# Patient Record
Sex: Male | Born: 1970 | Race: Black or African American | Hispanic: No | Smoking: Current every day smoker
Health system: Southern US, Community
[De-identification: ages and names within clinical notes are randomized; demographics above are authoritative.]

---

## 2020-01-20 ENCOUNTER — Encounter (HOSPITAL_COMMUNITY): Payer: Self-pay

## 2020-01-20 ENCOUNTER — Emergency Department (HOSPITAL_COMMUNITY)
Admission: EM | Admit: 2020-01-20 | Discharge: 2020-01-20 | Disposition: A | Discharge status: Home / Self Care | Payer: Self-pay | Attending: Emergency Medicine | Admitting: Emergency Medicine

## 2020-01-20 ENCOUNTER — Other Ambulatory Visit: Payer: Self-pay

## 2020-01-20 ENCOUNTER — Emergency Department (HOSPITAL_COMMUNITY): Payer: Self-pay

## 2020-01-20 DIAGNOSIS — F17210 Nicotine dependence, cigarettes, uncomplicated: Secondary | ICD-10-CM | POA: Insufficient documentation

## 2020-01-20 DIAGNOSIS — L988 Other specified disorders of the skin and subcutaneous tissue: Secondary | ICD-10-CM | POA: Insufficient documentation

## 2020-01-20 DIAGNOSIS — Q829 Congenital malformation of skin, unspecified: Secondary | ICD-10-CM

## 2020-01-20 NOTE — ED Triage Notes (Signed)
Patient has a growth on the right heel which is causing him pain

## 2020-01-20 NOTE — Discharge Instructions (Addendum)
Follow up with podiatry for growth on foot.

## 2020-01-20 NOTE — ED Notes (Signed)
Patient refused bulky dressing as ordered by PA.

## 2020-01-20 NOTE — ED Provider Notes (Signed)
[facility]-EMERGENCY DEPT Provider Note   CSN: [id number] Arrival date & time: 01/20/20  1146     History Chief Complaint  Patient presents with  . Foot Pain    Allen Washington is a 49 y.o. male.  49 year old male presents with a growth on his right heel, brought in by sister who translates. States growth has been present for several months, patient hit the growth on a door today which resulted in pain an opened the area slightly, no significant bleeding as a result. No other complaints, patient is ambulatory without difficulty.         History reviewed. No pertinent past medical history.  There are no problems to display for this patient.   History reviewed. No pertinent surgical history.     Family History  Problem Relation Age of Onset  . Thyroid disease Mother     Social History   Tobacco Use  . Smoking status: Current Every Day Smoker    Packs/day: 0.25    Types: Cigarettes  . Smokeless tobacco: Never Used  Vaping Use  . Vaping Use: Never used  Substance Use Topics  . Alcohol use: Never  . Drug use: Never    Home Medications Prior to Admission medications   Not on File    Allergies    Patient has no known allergies.  Review of Systems   Review of Systems  Constitutional: Negative for fever.  Musculoskeletal: Negative for arthralgias, gait problem and myalgias.  Skin: Positive for wound.  Allergic/Immunologic: Negative for immunocompromised state.    Physical Exam Updated Vital Signs BP 139/87 (BP Location: Right Arm)   Pulse 78   Temp 98.4 F (36.9 C) (Oral)   Resp 19   Ht 5\' 3"  (1.6 m)   Wt 64 kg   SpO2 99%   BMI 24.99 kg/m   Physical Exam Vitals and nursing note reviewed.  Constitutional:      General: He is not in acute distress.    Appearance: He is well-developed. He is not diaphoretic.  HENT:     Head: Normocephalic and atraumatic.  Pulmonary:     Effort: Pulmonary effort is normal.    Musculoskeletal:        General: Tenderness present. Normal range of motion.       Feet:  Skin:    General: Skin is warm and dry.     Findings: No erythema or rash.  Neurological:     Mental Status: He is alert and oriented to person, place, and time.     Gait: Gait normal.  Psychiatric:        Behavior: Behavior normal.         ED Results / Procedures / Treatments   Labs (all labs ordered are listed, but only abnormal results are displayed) Labs Reviewed - No data to display  EKG None  Radiology DG Os Calcis Right  Result Date: 01/20/2020 CLINICAL DATA:  Soft tissue growth EXAM: RIGHT OS CALCIS - 2+ VIEW COMPARISON:  None. FINDINGS: There is no evidence of fracture or other focal bone lesions. No bony erosion or periostitis. Joint spaces are well preserved. There is a protuberance of soft tissue density at the hindfoot posterior to the calcaneus measuring approximately 1.5 x 1.0 cm. No abnormal soft tissue mineralization. No soft tissue gas. IMPRESSION: 1. Soft tissue protuberance at the hindfoot posterior to the calcaneus measuring approximately 1.5 x 1.0 cm, nonspecific. 2. Otherwise unremarkable. Electronically Signed   By:  Nicholas  Plundo D.O.   On: 01/20/2020 12:56    Procedures Procedures (including critical care time)  Medications Ordered in ED Medications - No data to display  ED Course  I have reviewed the triage vital signs and the nursing notes.  Pertinent labs & imaging results that were available during my care of the patient were reviewed by me and considered in my medical decision making (see chart for details).  Clinical Course as of Jan 19 1306  Fri Jan 20, 2020  [id number] 49 year old male presents with gross off of his right heel which has been present for unspecified amount of time however became painful today when he hit it on a door.  Soft tissue mass as documented, pictures added to chart for records.  Patient had an x-ray which does not show any  bony involvement.  Patient was referred to podiatry for follow-up.   [LM]    Clinical Course User Index [LM] Alden Hipp   MDM Rules/Calculators/A&P                          Final Clinical Impression(s) / ED Diagnoses Final diagnoses:  Skin anomaly    Rx / DC Orders ED Discharge Orders    None       Alden Hipp 01/20/20 1307    Linwood Dibbles, MD 01/20/20 [phone]

## 2020-02-01 ENCOUNTER — Ambulatory Visit (INDEPENDENT_AMBULATORY_CARE_PROVIDER_SITE_OTHER): Payer: Self-pay | Admitting: Podiatry

## 2020-02-01 ENCOUNTER — Other Ambulatory Visit: Payer: Self-pay

## 2020-02-01 ENCOUNTER — Ambulatory Visit: Payer: Self-pay

## 2020-02-01 DIAGNOSIS — M7989 Other specified soft tissue disorders: Secondary | ICD-10-CM | POA: Diagnosis not present

## 2020-02-01 MED ORDER — TRAMADOL HCL 50 MG PO TABS: 50.0000 mg | ORAL_TABLET | ORAL | 0 refills | Status: AC | PRN

## 2020-02-03 NOTE — Progress Notes (Signed)
   HPI: 49 y.o. male presenting today as a new patient with a Nurse, learning disability.  Patient originally from [place] and has lived here and states for the past 3 years.  Patient states that ever since he was young boy he noticed a lesion to the posterior aspect of the right heel.  He believes that is been present for approximately 40 years, however over the past several months it has become increasingly painful.  He states that the lesion has grown in size over the past several years.  He is currently unable to wear shoes with a closed heel due to irritation of the lesion.  He denies trauma to the area.  He did present to the emergency department on 01/20/2020 for evaluation of the lesion and he was referred here for further treatment and evaluation.  No past medical history on file.    Physical Exam: General: The patient is alert and oriented x3 in no acute distress.  Dermatology: Skin is warm, dry and supple bilateral lower extremities.  Large circular prominent lesion noted to the posterior aspect of the heel protruding past the skin.  Measures approximately 2 cm in diameter.  Associated pain to palpation.  Please see photo from emergency department dated 01/20/2020.  Vascular: Palpable pedal pulses bilaterally. No edema or erythema noted. Capillary refill within normal limits.  Neurological: Epicritic and protective threshold grossly intact bilaterally.   Musculoskeletal Exam: Range of motion within normal limits to all pedal and ankle joints bilateral. Muscle strength 5/5 in all groups bilateral.  There is associated pain to palpation of the skin lesion  Radiographic Exam taken in ED 01/20/2020:  1. Soft tissue protuberance at the hindfoot posterior to the calcaneus measuring approximately 1.5 x 1.0 cm, nonspecific. 2. Otherwise unremarkable.  Assessment: 1.  Soft tissue mass/lesion of unknown etiology right posterior heel   Plan of Care:  1. Patient evaluated. X-Rays reviewed that were taken in  the ED.  2.  I explained to the patient that I am on sure what type of lesion this is.  Excisional biopsy would be needed to identify the lesion and best treat the patient.  The lesion does appear to be well circumscribed and complete excisional biopsy seems achievable. The patient opts for surgical management.  He is actually very anxious and looking forward to having the lesion removed.  All possible complications and details of the procedure were explained. All patient questions were answered. No guarantees were expressed or implied. 3. Authorization for surgery was initiated today. Surgery will consist of excisional biopsy right posterior heel lesion.  Application of graft right posterior heel. 4.  Return to clinic 1 week postop  *Originally from [place]        Sabrinia Prien M. Kacia Halley, DPM [facility]  Dr. Felecia Shelling, DPM    [address] [place], [place] [address]                Office [phone]  Fax [phone]

## 2020-02-10 ENCOUNTER — Telehealth: Payer: Self-pay

## 2020-02-10 NOTE — Telephone Encounter (Addendum)
DOS 02/16/2020  EXCISION OF LESION/FSOFT TISSUE MASS RT - 28041 APPLICATION OF SKIN GRAFT RT - 15275  [employer] EFFECTIVE DATE - 01/27/2020  PLAN DEDUCTIBLE - $1500.00 W/ $1263.91 REMAINING OUT OF POCKET - $4000.00 W/ $3763.91 REMAINING COPAY $0.00 COINSURANCE - 20%  NO AUTH REQUIRED PER WEBSITE

## 2020-02-16 ENCOUNTER — Other Ambulatory Visit: Payer: Self-pay | Admitting: Podiatry

## 2020-02-16 DIAGNOSIS — M7989 Other specified soft tissue disorders: Secondary | ICD-10-CM

## 2020-02-16 MED ORDER — OXYCODONE-ACETAMINOPHEN 5-325 MG PO TABS: 1.0000 | ORAL_TABLET | ORAL | 0 refills | Status: DC | PRN

## 2020-02-16 MED ORDER — IBUPROFEN 800 MG PO TABS: 800.0000 mg | ORAL_TABLET | Freq: Three times a day (TID) | ORAL | 1 refills | Status: AC

## 2020-02-16 NOTE — Progress Notes (Signed)
PRN postop 

## 2020-02-22 ENCOUNTER — Ambulatory Visit (INDEPENDENT_AMBULATORY_CARE_PROVIDER_SITE_OTHER): Payer: BC Managed Care – PPO | Admitting: Podiatry

## 2020-02-22 ENCOUNTER — Encounter: Payer: Self-pay | Admitting: Podiatry

## 2020-02-22 ENCOUNTER — Other Ambulatory Visit: Payer: Self-pay

## 2020-02-22 VITALS — Temp 98.0°F

## 2020-02-22 DIAGNOSIS — Z9889 Other specified postprocedural states: Secondary | ICD-10-CM

## 2020-02-22 DIAGNOSIS — M7989 Other specified soft tissue disorders: Secondary | ICD-10-CM

## 2020-02-22 NOTE — Progress Notes (Signed)
° °  Subjective:  Patient presents today status post excision of soft tissue mass right posterior heel. DOS: 02/17/2020.  Patient states that he is feeling much better.  He is left the dressings clean dry and intact as instructed.  He is weightbearing in the postsurgical shoe as directed.  No new complaints at this time  No past medical history on file.    Objective/Physical Exam Neurovascular status intact.  The wound to the right posterior heel appears to be healthy and viable.  The graft is intact.  It was left in place today.  Wound measures approximately 2.5 cm in diameter.   Assessment: 1. s/p excision soft tissue mass right posterior heel. DOS: 02/17/2020   Plan of Care:  1. Patient was evaluated.  2.  Dressings changed today.  Keep clean dry and intact x1 week 3.  Return to clinic in 1 week for dressing change 4.  Continue weightbearing in the surgical shoe 5.  Patient will not be able to return to work for approximately 3 additional weeks from today.   Felecia Shelling, DPM [facility]  Dr. Felecia Shelling, DPM    [address] [place], [place] [address]                Office [phone]  Fax [phone]

## 2020-02-24 ENCOUNTER — Encounter: Payer: Self-pay | Admitting: Podiatry

## 2020-02-29 ENCOUNTER — Other Ambulatory Visit: Payer: Self-pay

## 2020-02-29 ENCOUNTER — Encounter: Payer: BC Managed Care – PPO | Admitting: Podiatry

## 2020-02-29 ENCOUNTER — Ambulatory Visit (INDEPENDENT_AMBULATORY_CARE_PROVIDER_SITE_OTHER): Payer: BC Managed Care – PPO | Admitting: Podiatry

## 2020-02-29 DIAGNOSIS — Z9889 Other specified postprocedural states: Secondary | ICD-10-CM

## 2020-02-29 DIAGNOSIS — M7989 Other specified soft tissue disorders: Secondary | ICD-10-CM

## 2020-02-29 NOTE — Progress Notes (Signed)
   Subjective:  Patient presents today status post excision of soft tissue mass right posterior heel. DOS: 02/17/2020.  Patient continues to feel good and is improving.  He is currently not working to allow for healing of his posterior heel.  No new complaints at this time.  No past medical history on file.    Objective/Physical Exam Neurovascular status intact.  The wound to the right posterior heel appears to be healthy and viable.  The graft is intact.  There does appear to be good healthy granular tissue growing within the wound bed and the graft appears to be completely resorbed within the tissues.  Wound measures approximately 2.5 cm in diameter.  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 s/p excision soft tissue mass right posterior heel. DOS: 02/17/2020   Plan of Care:  1. Patient was evaluated.  2.  The surgical pathology was reviewed with the patient today.  I did explain to the patient that although this is a squamous cell carcinoma the margins appear clear and uninvolved.  After discussion with the pathologist I do not feel that there is a need for oncology follow-up.  The carcinoma was well differentiated and complete excision appears to have been successful.  We will monitor the patient conservatively for the moment 3.  Dressings were reapplied today.  Keep clean dry and intact x1 week 4.  Return to clinic in 1 week   Felecia Shelling, DPM [facility]  Dr. Felecia Shelling, DPM    [address] [place], [place] [address]                Office [phone]  Fax [phone]

## 2020-03-07 ENCOUNTER — Encounter: Payer: BC Managed Care – PPO | Admitting: Podiatry

## 2020-03-07 ENCOUNTER — Other Ambulatory Visit: Payer: Self-pay

## 2020-03-07 ENCOUNTER — Ambulatory Visit (INDEPENDENT_AMBULATORY_CARE_PROVIDER_SITE_OTHER): Payer: BC Managed Care – PPO | Admitting: Podiatry

## 2020-03-07 ENCOUNTER — Encounter: Payer: Self-pay | Admitting: Podiatry

## 2020-03-07 DIAGNOSIS — Z9889 Other specified postprocedural states: Secondary | ICD-10-CM

## 2020-03-07 DIAGNOSIS — M7989 Other specified soft tissue disorders: Secondary | ICD-10-CM

## 2020-03-07 MED ORDER — OXYCODONE-ACETAMINOPHEN 5-325 MG PO TABS
1.0000 | ORAL_TABLET | Freq: Four times a day (QID) | ORAL | 0 refills | Status: AC | PRN
Start: 2020-03-07 — End: ?

## 2020-03-07 NOTE — Progress Notes (Signed)
   Subjective:  Patient presents today status post excision of soft tissue mass right posterior heel. DOS: 02/17/2020.  Patient continues to have some intermittent pain that is very painful and keeps him up at night no new complaints at this time.  No past medical history on file.    Objective/Physical Exam Neurovascular status intact.  The wound to the right posterior heel appears to be healthy and viable.  The graft is intact.  There does appear to be good healthy granular tissue growing within the wound bed and the graft appears to be completely resorbed within the tissues.  Wound measures approximately 1.5 cm in diameter.  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 s/p excision soft tissue mass right posterior heel. DOS: 02/17/2020   Plan of Care:  1. Patient was evaluated.  2. Prisma collagen dressing was applied today.  Supplies were provided for the patient to apply the Prisma collagen dressing daily 3.  Patient is very sedentary during work.  He may return to work full activity no restrictions beginning 03/12/2020 in the postsurgical shoe 4.  Refill prescription for Percocet 5/325 mg  5.  Return to clinic in 2 weeks  Felecia Shelling, DPM [facility]  Dr. Felecia Shelling, DPM    [address] [place], [place] [address]                Office [phone]  Fax [phone]

## 2020-03-14 ENCOUNTER — Encounter: Payer: BC Managed Care – PPO | Admitting: Podiatry

## 2020-03-21 ENCOUNTER — Encounter: Payer: BC Managed Care – PPO | Admitting: Podiatry

## 2021-10-15 IMAGING — CR DG OS CALCIS 2+V*R*
2 series · 2 of 2 positions shown · non-contrast
Comparison: None.

CLINICAL DATA: Soft tissue growth

EXAM:
RIGHT OS CALCIS - 2+ VIEW

[x calcaneus lat right]
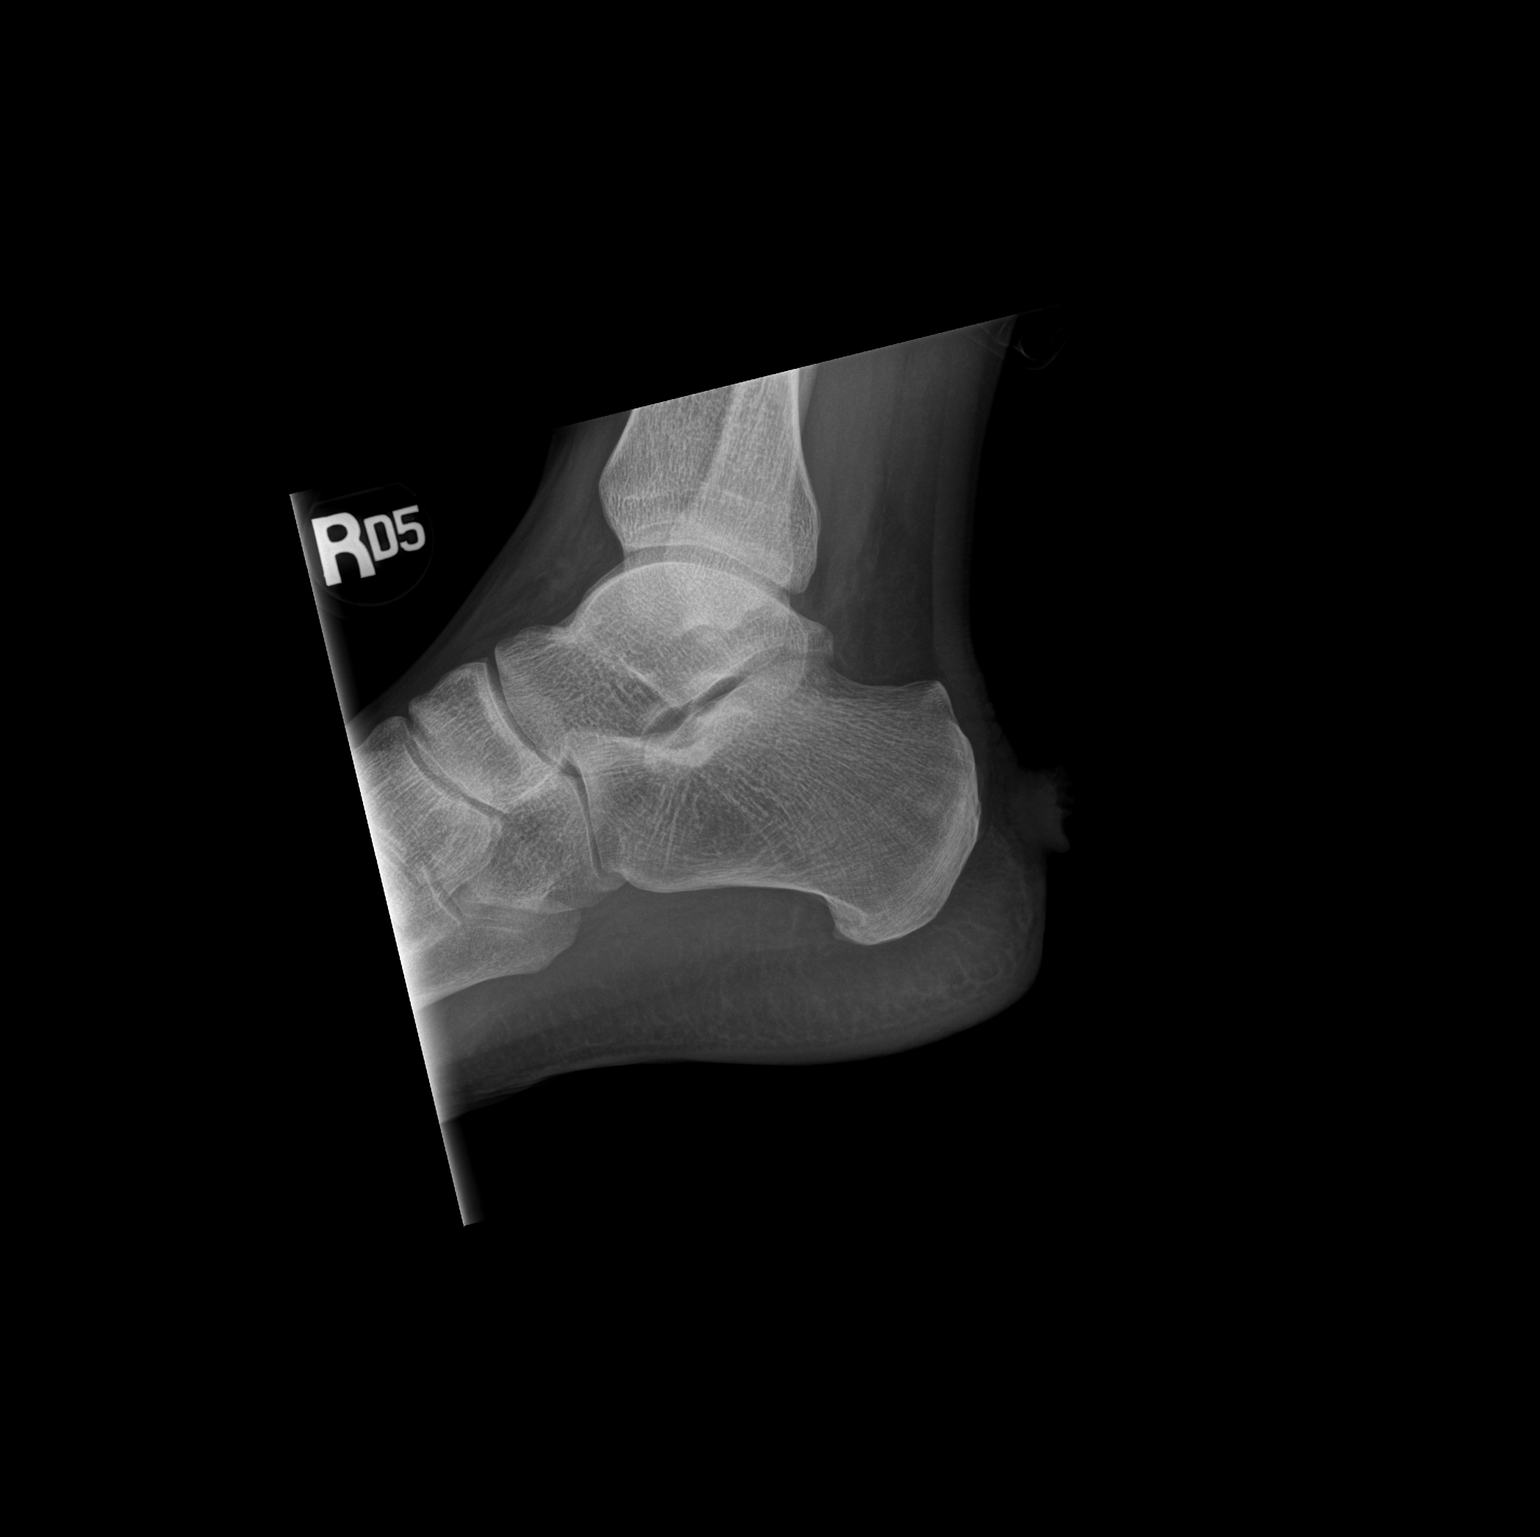

[x calcaneus axial right]
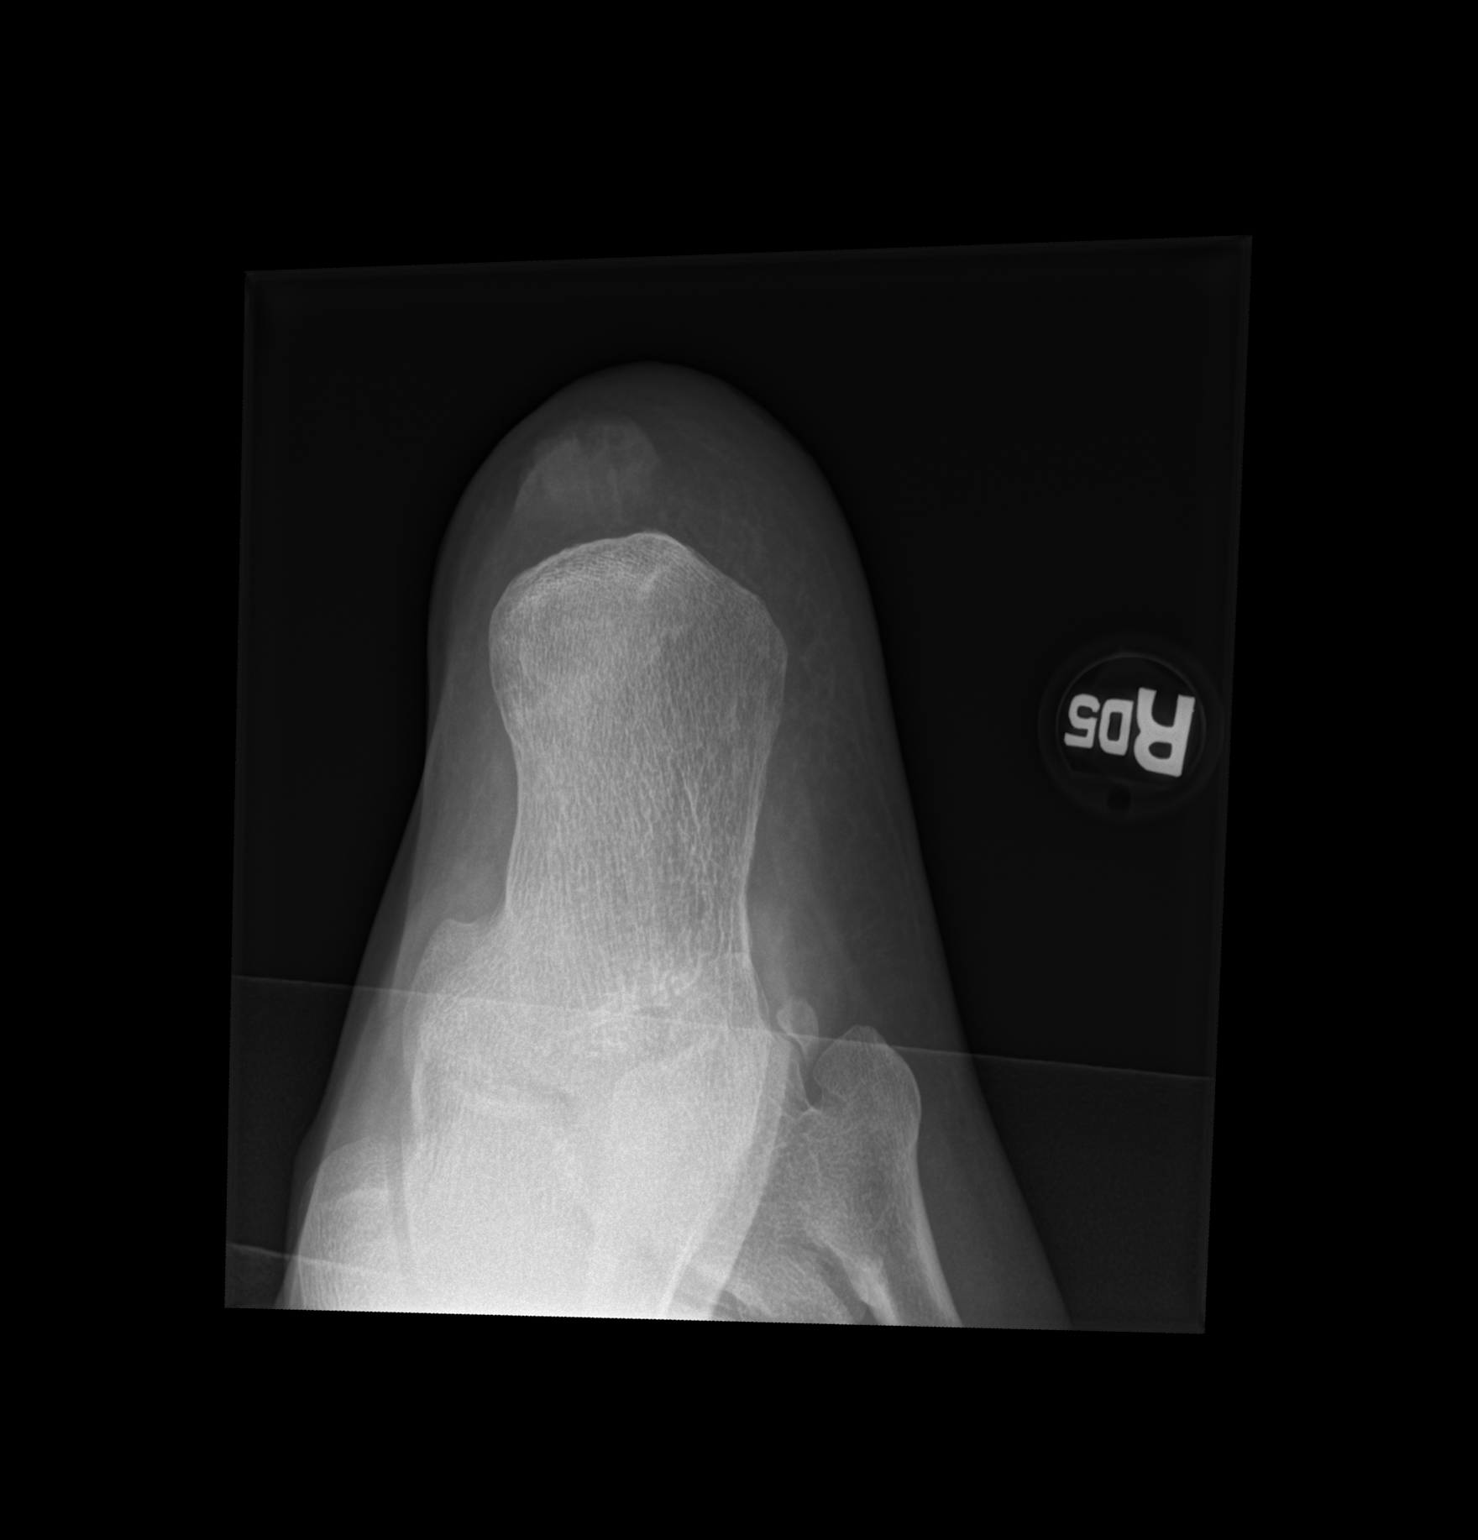

[2 of 2 positions shown; findings below may reference images not displayed]

FINDINGS: There is no evidence of fracture or other focal bone lesions. No
bony erosion or periostitis. Joint spaces are well preserved. There
is a protuberance of soft tissue density at the hindfoot posterior
to the calcaneus measuring approximately 1.5 x 1.0 cm. No abnormal
soft tissue mineralization. No soft tissue gas.
IMPRESSION: 1. Soft tissue protuberance at the hindfoot posterior to the
calcaneus measuring approximately 1.5 x 1.0 cm, nonspecific.
2. Otherwise unremarkable.
